# Patient Record
Sex: Female | Born: 1995 | Hispanic: No | Marital: Married | Smoking: Never smoker
Health system: Southern US, Community
[De-identification: ages and names within clinical notes are randomized; demographics above are authoritative.]

## PROBLEM LIST (undated history)

## (undated) ENCOUNTER — Inpatient Hospital Stay (HOSPITAL_COMMUNITY): Payer: Self-pay

## (undated) DIAGNOSIS — Z789 Other specified health status: Secondary | ICD-10-CM

## (undated) HISTORY — PX: NO PAST SURGERIES: SHX2092

---

## 2017-05-01 NOTE — L&D Delivery Note (Signed)
Delivery Note Mom pushed with excelled effort. Status int on the line during pushing and delivery. Terminal bradycardia noted and a t 2:19 PM a viable and healthy female was delivered via Vaginal, Spontaneous (Presentation: VTX;LOA  ).  APGAR: 6, 9; weight pending  .   Placenta status: schultz,  Complete/intact.  Cord: tight nuchal x1, unable to reduce, somersault through  with the following complications: .  Cord pH: NA  Anesthesia:  Epidural, local  Episiotomy: None Lacerations: 2nd degree Suture Repair: 3.0 monocryl  Est. Blood Loss (mL): 200  Mom to postpartum.  Baby to Couplet care / Skin to Skin.  Thressa ShellerHeather Kingston Guiles 08/17/2017, 2:46 PM

## 2017-07-22 ENCOUNTER — Encounter (HOSPITAL_COMMUNITY): Payer: Self-pay

## 2017-07-22 ENCOUNTER — Inpatient Hospital Stay (HOSPITAL_COMMUNITY): Payer: PPO

## 2017-07-22 ENCOUNTER — Inpatient Hospital Stay (HOSPITAL_COMMUNITY)
Admission: AD | Admit: 2017-07-22 | Discharge: 2017-07-22 | Disposition: A | Discharge status: Home / Self Care | Payer: PPO | Source: Ambulatory Visit | Attending: Obstetrics and Gynecology | Admitting: Obstetrics and Gynecology

## 2017-07-22 DIAGNOSIS — O0930 Supervision of pregnancy with insufficient antenatal care, unspecified trimester: Secondary | ICD-10-CM

## 2017-07-22 DIAGNOSIS — R12 Heartburn: Secondary | ICD-10-CM | POA: Insufficient documentation

## 2017-07-22 DIAGNOSIS — O0933 Supervision of pregnancy with insufficient antenatal care, third trimester: Secondary | ICD-10-CM

## 2017-07-22 DIAGNOSIS — O9989 Other specified diseases and conditions complicating pregnancy, childbirth and the puerperium: Secondary | ICD-10-CM

## 2017-07-22 DIAGNOSIS — Z3A35 35 weeks gestation of pregnancy: Secondary | ICD-10-CM

## 2017-07-22 DIAGNOSIS — B373 Candidiasis of vulva and vagina: Secondary | ICD-10-CM

## 2017-07-22 DIAGNOSIS — O26899 Other specified pregnancy related conditions, unspecified trimester: Secondary | ICD-10-CM | POA: Diagnosis not present

## 2017-07-22 DIAGNOSIS — Z3A00 Weeks of gestation of pregnancy not specified: Secondary | ICD-10-CM | POA: Diagnosis not present

## 2017-07-22 DIAGNOSIS — R109 Unspecified abdominal pain: Secondary | ICD-10-CM | POA: Diagnosis present

## 2017-07-22 DIAGNOSIS — O219 Vomiting of pregnancy, unspecified: Secondary | ICD-10-CM

## 2017-07-22 DIAGNOSIS — B3731 Acute candidiasis of vulva and vagina: Secondary | ICD-10-CM

## 2017-07-22 HISTORY — DX: Other specified health status: Z78.9

## 2017-07-22 LAB — WET PREP, GENITAL
Clue Cells Wet Prep HPF POC: NONE SEEN
Sperm: NONE SEEN
Trich, Wet Prep: NONE SEEN

## 2017-07-22 LAB — CBC
HCT: 29.8 % — ABNORMAL LOW (ref 36.0–46.0)
Hemoglobin: 9.6 g/dL — ABNORMAL LOW (ref 12.0–15.0)
MCH: 21.1 pg — ABNORMAL LOW (ref 26.0–34.0)
MCHC: 32.2 g/dL (ref 30.0–36.0)
MCV: 65.4 fL — ABNORMAL LOW (ref 78.0–100.0)
Platelets: 236 10*3/uL (ref 150–400)
RBC: 4.56 MIL/uL (ref 3.87–5.11)
RDW: 18.3 % — ABNORMAL HIGH (ref 11.5–15.5)
WBC: 7.7 10*3/uL (ref 4.0–10.5)

## 2017-07-22 LAB — URINALYSIS, ROUTINE W REFLEX MICROSCOPIC
Bilirubin Urine: NEGATIVE
GLUCOSE, UA: NEGATIVE mg/dL
HGB URINE DIPSTICK: NEGATIVE
Ketones, ur: NEGATIVE mg/dL
NITRITE: NEGATIVE
PH: 6 (ref 5.0–8.0)
Protein, ur: NEGATIVE mg/dL
Specific Gravity, Urine: 1.011 (ref 1.005–1.030)

## 2017-07-22 LAB — OB RESULTS CONSOLE GBS: GBS: POSITIVE

## 2017-07-22 MED ORDER — TERCONAZOLE 0.8 % VA CREA: 1.0000 | TOPICAL_CREAM | Freq: Every day | VAGINAL | 0 refills | Status: AC

## 2017-07-22 MED ORDER — ONDANSETRON 8 MG PO TBDP
8.0000 mg | ORAL_TABLET | Freq: Once | ORAL | Status: AC
  Administered 2017-07-22: 8 mg via ORAL
  Filled 2017-07-22: qty 1

## 2017-07-22 MED ORDER — PRENATAL VITAMINS 0.8 MG PO TABS: 1.0000 | ORAL_TABLET | Freq: Every day | ORAL | 1 refills | Status: DC

## 2017-07-22 MED ORDER — RANITIDINE HCL 300 MG PO TABS: 300.0000 mg | ORAL_TABLET | Freq: Every day | ORAL | 1 refills | Status: AC

## 2017-07-22 MED ORDER — ACETAMINOPHEN 500 MG PO TABS
1000.0000 mg | ORAL_TABLET | Freq: Once | ORAL | Status: AC
  Administered 2017-07-22: 1000 mg via ORAL
  Filled 2017-07-22: qty 2

## 2017-07-22 NOTE — MAU Provider Note (Addendum)
History     CSN: [id number] Arrival date and time: 07/22/17 1850    Chief Complaint  Patient presents with  . Abdominal Pain   Patient with PNC in [place], moved earlier this month with no contributory medical history presents for 2 week h/o nausea, heartburn, and occasional vomiting. Denies sick contacts, diarrhea. Does not know due date, dating today based on LMP. Also c/o few weeks of increased yellow vaginal discharge, received vaginal Miconazole from provider in [place] with not much relief. 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  OB History    Gravida  4   Para      Term      Preterm      AB  3   Living        SAB  3   TAB      Ectopic      Multiple      Live Births              Past Medical History:  Diagnosis Date  . Medical history non-contributory     Past Surgical History:  Procedure Laterality Date  . NO PAST SURGERIES     Family History  Problem Relation Age of Onset  . Alcohol abuse Neg Hx   . Arthritis Neg Hx   . Asthma Neg Hx   . Cancer Neg Hx   . Birth defects Neg Hx   . COPD Neg Hx   . Depression Neg Hx   . Drug abuse Neg Hx   . Diabetes Neg Hx   . Early death Neg Hx   . Hearing loss Neg Hx   . Heart disease Neg Hx   . Hyperlipidemia Neg Hx   . Kidney disease Neg Hx   . Hypertension Neg Hx   . Mental illness Neg Hx   . Learning disabilities Neg Hx   . Mental retardation Neg Hx   . Miscarriages / Stillbirths Neg Hx   . Stroke Neg Hx   . Vision loss Neg Hx   . Varicose Veins Neg Hx     Social History   Tobacco Use  . Smoking status: Never Smoker  . Smokeless tobacco: Never Used  Substance Use Topics  . Alcohol use: Never    Frequency: Never  . Drug use: Never    Allergies: No Known Allergies  No medications prior to admission.    Review of Systems  Constitutional: Negative for fever.  Respiratory: Negative for  shortness of breath.   Gastrointestinal: Positive for nausea and vomiting. Negative for diarrhea.       No hematemesis  Genitourinary: Positive for dyspareunia, vaginal discharge and vaginal pain. Negative for vaginal bleeding.       Vaginal itching   Physical Exam   Blood pressure 123/79, pulse (!) 110, temperature 98.3 F (36.8 C), temperature source Oral, resp. rate 16, height 5\' 6"  (1.676 m), weight 116 lb (52.6 kg), SpO2 100 %.  Physical Exam  Constitutional: She is oriented to person, place, and time. She appears well-developed and well-nourished. No distress.  Cardiovascular: Normal rate.  Respiratory: Effort normal. No respiratory distress.  GI:  Gravid abdomen  Neurological: She is alert and oriented to person, place, and time.  Skin: Skin is warm and dry. She is not diaphoretic.  Psychiatric: She has a normal mood and affect. Her behavior is normal.   FHT: baseline rate 135, moderate variability, +acel,  no decel  MAU Course  Procedures  MDM Patient with symptoms consistent with heartburn. Zofran given for nausea with some improvement in symptoms. Initial prenatal labs drawn today. Wet prep c/w yeast infection. U/A with moderate leuks and rare bacteria, no nitrities, urine culture sent.  Limited US performed with no abnormalities noted.  Reactive NST  Assessment and Plan   Heartburn - Zantac 300mg  daily Yeast infection - vaginal fluconazole Limited PNC  - Will make patient an appointment to follow up for prenatal appointment with complete US. Initial prenatal labs drawn today. Given prescription for prenatal vitamins. Discharge to home.  Ellwood Denselison Rumball 07/22/2017, 9:38 PM   OB FELLOW MAU DISCHARGE ATTESTATION I have seen and examined this patient; I agree with above documentation in the resident's note.   S: 22 y.o. G4P0030 with IUP at 1052w4d by LMP p/w nausea, vomiting about twice daily and GER symptoms, as well as sx of yeast vaginitis. [place] in [place], move here earlier this month, but no established care. Reports good fetal movement. Denies ctx, vaginal bleeding, LOF.   O: VS wnl as above. Normal PE  Wet prep c/w yeast vaginitis Urine sent for culture Prenatal labs drawn and GBS culture collected Nausea improved with Zofran. Reactive NST as above  A/P: - Yeast vaginitis: terconazole - GER/nausea: start daily H2B - Limited PNC: complete U/S ordered to be outpatient, and message sent to clinic to schedule PNV ASAP.   Frederik PearJulie P Degele, MD OB Fellow 12:02 AM

## 2017-07-22 NOTE — MAU Note (Signed)
Pt reports vomiting and lower abdominal pain that started 5 days ago-states she has not taken any medication for this. Pt states she has vomited only once. Pt denies bleeding or LOF. Pt reports good fetal movement. States she is [redacted] weeks pregnant. States she just moved here and has not seen a doctor here-[facility].

## 2017-07-22 NOTE — Discharge Instructions (Signed)

## 2017-07-23 LAB — HEPATITIS B SURFACE ANTIGEN: Hepatitis B Surface Ag: NEGATIVE

## 2017-07-23 LAB — RPR: RPR Ser Ql: NONREACTIVE

## 2017-07-23 LAB — GC/CHLAMYDIA PROBE AMP (~~LOC~~) NOT AT ARMC
Chlamydia: NEGATIVE
Neisseria Gonorrhea: NEGATIVE

## 2017-07-23 LAB — ABO/RH: ABO/RH(D): B POS

## 2017-07-23 LAB — HIV ANTIBODY (ROUTINE TESTING W REFLEX): HIV Screen 4th Generation wRfx: NONREACTIVE

## 2017-07-23 NOTE — Addendum Note (Signed)
Encounter addended by: Allegra LaiNewman, Mark Glidwell, MD on: 07/23/2017 8:41 AM  Actions taken: Charge Capture section accepted

## 2017-07-24 ENCOUNTER — Encounter: Payer: Self-pay | Admitting: Obstetrics and Gynecology

## 2017-07-24 DIAGNOSIS — R8271 Bacteriuria: Secondary | ICD-10-CM | POA: Insufficient documentation

## 2017-07-24 LAB — CULTURE, BETA STREP (GROUP B ONLY)

## 2017-07-24 LAB — RUBELLA SCREEN: Rubella: 3.39 index (ref >0.99)

## 2017-07-24 LAB — CULTURE, OB URINE

## 2017-07-25 ENCOUNTER — Telehealth: Payer: Self-pay | Admitting: Advanced Practice Midwife

## 2017-07-25 NOTE — Telephone Encounter (Signed)
Pt positive for GBS UTI, with 10,000 colonies only.  Attempt to call pt but voicemail not set up.  Attempt to send Rx for Keflex to pharmacy but no pharmacy in chart.  Will add to sticky note, [facility]

## 2017-07-27 ENCOUNTER — Other Ambulatory Visit (HOSPITAL_COMMUNITY): Payer: Self-pay | Admitting: Certified Nurse Midwife

## 2017-07-27 ENCOUNTER — Ambulatory Visit (HOSPITAL_COMMUNITY)
Admission: RE | Admit: 2017-07-27 | Discharge: 2017-07-27 | Disposition: A | Discharge status: Home / Self Care | Payer: PPO | Source: Ambulatory Visit | Attending: Certified Nurse Midwife | Admitting: Certified Nurse Midwife

## 2017-07-27 DIAGNOSIS — Z3689 Encounter for other specified antenatal screening: Secondary | ICD-10-CM

## 2017-07-27 DIAGNOSIS — O0930 Supervision of pregnancy with insufficient antenatal care, unspecified trimester: Secondary | ICD-10-CM

## 2017-07-27 DIAGNOSIS — Z3A36 36 weeks gestation of pregnancy: Secondary | ICD-10-CM | POA: Diagnosis not present

## 2017-07-27 DIAGNOSIS — O0933 Supervision of pregnancy with insufficient antenatal care, third trimester: Secondary | ICD-10-CM | POA: Insufficient documentation

## 2017-07-27 NOTE — Addendum Note (Signed)
Encounter addended by: Marcellina MillinMoser, Tykeem Lanzer L, RTR on: 07/27/2017 4:29 PM  Actions taken: Imaging Exam ended

## 2017-07-31 ENCOUNTER — Telehealth: Payer: Self-pay | Admitting: General Practice

## 2017-07-31 ENCOUNTER — Encounter: Payer: Self-pay | Admitting: General Practice

## 2017-07-31 NOTE — Telephone Encounter (Signed)
Called patient to schedule New OB appointment (with interpreter), unable to reach patient via phone.  Letter has been mailed to patient.

## 2017-08-08 ENCOUNTER — Ambulatory Visit (INDEPENDENT_AMBULATORY_CARE_PROVIDER_SITE_OTHER): Payer: Self-pay

## 2017-08-08 VITALS — BP 116/72 | HR 87 | Wt 117.1 lb

## 2017-08-08 DIAGNOSIS — Z3400 Encounter for supervision of normal first pregnancy, unspecified trimester: Secondary | ICD-10-CM

## 2017-08-08 DIAGNOSIS — Z3403 Encounter for supervision of normal first pregnancy, third trimester: Secondary | ICD-10-CM

## 2017-08-08 DIAGNOSIS — R8271 Bacteriuria: Secondary | ICD-10-CM

## 2017-08-08 LAB — POCT URINALYSIS DIP (DEVICE)
Bilirubin Urine: NEGATIVE
Glucose, UA: NEGATIVE mg/dL
Ketones, ur: NEGATIVE mg/dL
Nitrite: NEGATIVE
Protein, ur: NEGATIVE mg/dL
Specific Gravity, Urine: 1.015 (ref 1.005–1.030)
Urobilinogen, UA: 1 mg/dL (ref 0.0–1.0)
pH: 6 (ref 5.0–8.0)

## 2017-08-08 MED ORDER — FERROUS SULFATE 325 (65 FE) MG PO TABS: 325.0000 mg | ORAL_TABLET | Freq: Every day | ORAL | 3 refills | Status: AC

## 2017-08-08 MED ORDER — PRENATAL VITAMINS 0.8 MG PO TABS: 1.0000 | ORAL_TABLET | Freq: Every day | ORAL | 6 refills | Status: DC

## 2017-08-08 MED ORDER — CEPHALEXIN 500 MG PO CAPS: 500.0000 mg | ORAL_CAPSULE | Freq: Three times a day (TID) | ORAL | 0 refills | Status: AC

## 2017-08-08 MED ORDER — PRENATAL 27-0.8 MG PO TABS: 1.0000 | ORAL_TABLET | Freq: Every day | ORAL | 11 refills | Status: AC

## 2017-08-08 NOTE — Progress Notes (Signed)
Decline flu and Tdap and Pap smear.

## 2017-08-08 NOTE — Progress Notes (Signed)
Subjective:   Peggy Kelley is a 22 y.o. G4P0030 at [redacted]w[redacted]d by LMP being seen today for her first obstetrical visit.  Her obstetrical history is significant for 3 previous miscarriages and has GBS bacteriuria and Supervision of normal first pregnancy, antepartum on their problem list.. Patient does intend to breast feed. Pregnancy history fully reviewed.  Patient reports no complaints. She recently moved from [place] and reports no problems during prenatal care.  HISTORY: OB History  Gravida Para Term Preterm AB Living  4 0 0 0 3 0  SAB TAB Ectopic Multiple Live Births  3 0 0 0 0    # Outcome Date GA Lbr Len/2nd Weight Sex Delivery Anes PTL Lv  4 Current           3 SAB           2 SAB           1 SAB            Past Medical History:  Diagnosis Date  . Medical history non-contributory    Past Surgical History:  Procedure Laterality Date  . NO PAST SURGERIES     Family History  Problem Relation Age of Onset  . Alcohol abuse Neg Hx   . Arthritis Neg Hx   . Asthma Neg Hx   . Cancer Neg Hx   . Birth defects Neg Hx   . COPD Neg Hx   . Depression Neg Hx   . Drug abuse Neg Hx   . Diabetes Neg Hx   . Early death Neg Hx   . Hearing loss Neg Hx   . Heart disease Neg Hx   . Hyperlipidemia Neg Hx   . Kidney disease Neg Hx   . Hypertension Neg Hx   . Mental illness Neg Hx   . Learning disabilities Neg Hx   . Mental retardation Neg Hx   . Miscarriages / Stillbirths Neg Hx   . Stroke Neg Hx   . Vision loss Neg Hx   . Varicose Veins Neg Hx    Social History   Tobacco Use  . Smoking status: Never Smoker  . Smokeless tobacco: Never Used  Substance Use Topics  . Alcohol use: Never    Frequency: Never  . Drug use: Never   No Known Allergies Current Outpatient Medications on File Prior to Visit  Medication Sig Dispense Refill  . ranitidine (ZANTAC) 300 MG tablet Take 1 tablet (300 mg total) by mouth at bedtime. 30 tablet 1  . terconazole (TERAZOL 3) 0.8 % vaginal  cream Place 1 applicator vaginally at bedtime. (Patient not taking: Reported on 08/08/2017) 20 g 0   No current facility-administered medications on file prior to visit.      Exam   Vitals:   08/08/17 1354  BP: 116/72  Pulse: 87  Weight: 117 lb 1.6 oz (53.1 kg)   Fetal Heart Rate (bpm): 153  Uterus:  Fundal Height: 38 cm  Pelvic Exam: Perineum: no hemorrhoids, normal perineum   Vulva: normal external genitalia, no lesions   Vagina:  normal mucosa, normal discharge   Cervix: no lesions and normal, pap smear done.    Adnexa: normal adnexa and no mass, fullness, tenderness   Bony Pelvis: average  System: General: well-developed, well-nourished female in no acute distress   Breast:  normal appearance, no masses or tenderness   Skin: normal coloration and turgor, no rashes   Neurologic: oriented, normal, negative, normal mood  Extremities: normal strength, tone, and muscle mass, ROM of all joints is normal   HEENT PERRLA, extraocular movement intact and sclera clear, anicteric   Mouth/Teeth mucous membranes moist, pharynx normal without lesions and dental hygiene good   Neck supple and no masses   Cardiovascular: regular rate and rhythm   Respiratory:  no respiratory distress, normal breath sounds   Abdomen: soft, non-tender; bowel sounds normal; no masses,  no organomegaly     Assessment:   Pregnancy: G4P0030 Patient Active Problem List   Diagnosis Date Noted  . Supervision of normal first pregnancy, antepartum 08/08/2017  . GBS bacteriuria 07/24/2017     Plan:  1. GBS bacteriuria - RX sent to patient's pharmacy - Will need prophylaxis in labor  2. Supervision of normal first pregnancy, antepartum - No complaints. Routine care - Hgb 9.6 with prenatal labs, supplement sent to patient's pharmacy  Continue prenatal vitamins. Genetic Screening discussed, First trimester screen, Quad screen and NIPS: too late. Ultrasound discussed; fetal anatomic survey: results  reviewed. Problem list reviewed and updated. The nature of [facility] with multiple MDs and other Advanced Practice Providers was explained to patient; also emphasized that residents, students are part of our team. Routine obstetric precautions reviewed. Return in about 1 week (around 08/15/2017) for Return OB visit.  Rolm BookbinderCaroline M Neill, CNM 08/08/17 2:59 PM

## 2017-08-08 NOTE — Addendum Note (Signed)
Addended by: Rolm BookbinderNEILL, Westyn Keatley M on: 08/08/2017 04:28 PM   Modules accepted: Orders

## 2017-08-08 NOTE — Patient Instructions (Signed)

## 2017-08-17 ENCOUNTER — Inpatient Hospital Stay (HOSPITAL_COMMUNITY): Payer: PPO | Admitting: Anesthesiology

## 2017-08-17 ENCOUNTER — Inpatient Hospital Stay (HOSPITAL_COMMUNITY)
Admission: AD | Admit: 2017-08-17 | Discharge: 2017-08-19 | DRG: 807 | Disposition: A | Discharge status: Home / Self Care | Payer: PPO | Source: Ambulatory Visit | Attending: Family Medicine | Admitting: Family Medicine

## 2017-08-17 ENCOUNTER — Encounter (HOSPITAL_COMMUNITY): Payer: Self-pay | Admitting: *Deleted

## 2017-08-17 DIAGNOSIS — O99824 Streptococcus B carrier state complicating childbirth: Secondary | ICD-10-CM | POA: Diagnosis present

## 2017-08-17 DIAGNOSIS — O4292 Full-term premature rupture of membranes, unspecified as to length of time between rupture and onset of labor: Principal | ICD-10-CM | POA: Diagnosis present

## 2017-08-17 DIAGNOSIS — Z3A39 39 weeks gestation of pregnancy: Secondary | ICD-10-CM

## 2017-08-17 DIAGNOSIS — R8271 Bacteriuria: Secondary | ICD-10-CM

## 2017-08-17 LAB — CBC
HCT: 28.5 % — ABNORMAL LOW (ref 36.0–46.0)
Hemoglobin: 9.2 g/dL — ABNORMAL LOW (ref 12.0–15.0)
MCH: 20.1 pg — AB (ref 26.0–34.0)
MCHC: 32.3 g/dL (ref 30.0–36.0)
MCV: 62.4 fL — AB (ref 78.0–100.0)
Platelets: 213 10*3/uL (ref 150–400)
RBC: 4.57 MIL/uL (ref 3.87–5.11)
RDW: 19.2 % — AB (ref 11.5–15.5)
WBC: 8.2 10*3/uL (ref 4.0–10.5)

## 2017-08-17 LAB — TYPE AND SCREEN
ABO/RH(D): B POS
Antibody Screen: NEGATIVE

## 2017-08-17 MED ORDER — DIPHENHYDRAMINE HCL 25 MG PO CAPS: 25.0000 mg | ORAL_CAPSULE | Freq: Four times a day (QID) | ORAL | Status: DC | PRN

## 2017-08-17 MED ORDER — PENICILLIN G POT IN DEXTROSE 60000 UNIT/ML IV SOLN
3.0000 10*6.[IU] | INTRAVENOUS | Status: DC
  Administered 2017-08-17: 3 10*6.[IU] via INTRAVENOUS
  Filled 2017-08-17 (×4): qty 50

## 2017-08-17 MED ORDER — SIMETHICONE 80 MG PO CHEW: 80.0000 mg | CHEWABLE_TABLET | ORAL | Status: DC | PRN

## 2017-08-17 MED ORDER — DIBUCAINE 1 % RE OINT: 1.0000 "application " | TOPICAL_OINTMENT | RECTAL | Status: DC | PRN

## 2017-08-17 MED ORDER — SODIUM CHLORIDE 0.9 % IV SOLN
5.0000 10*6.[IU] | Freq: Once | INTRAVENOUS | Status: AC
  Administered 2017-08-17: 5 10*6.[IU] via INTRAVENOUS
  Filled 2017-08-17: qty 5

## 2017-08-17 MED ORDER — OXYCODONE-ACETAMINOPHEN 5-325 MG PO TABS: 1.0000 | ORAL_TABLET | ORAL | Status: DC | PRN

## 2017-08-17 MED ORDER — ONDANSETRON HCL 4 MG/2ML IJ SOLN: 4.0000 mg | Freq: Four times a day (QID) | INTRAMUSCULAR | Status: DC | PRN

## 2017-08-17 MED ORDER — FENTANYL CITRATE (PF) 100 MCG/2ML IJ SOLN
100.0000 ug | INTRAMUSCULAR | Status: DC | PRN
  Filled 2017-08-17: qty 2

## 2017-08-17 MED ORDER — TETANUS-DIPHTH-ACELL PERTUSSIS 5-2.5-18.5 LF-MCG/0.5 IM SUSP: 0.5000 mL | Freq: Once | INTRAMUSCULAR | Status: DC

## 2017-08-17 MED ORDER — ACETAMINOPHEN 325 MG PO TABS: 650.0000 mg | ORAL_TABLET | ORAL | Status: DC | PRN

## 2017-08-17 MED ORDER — OXYTOCIN BOLUS FROM INFUSION
500.0000 mL | Freq: Once | INTRAVENOUS | Status: AC
  Administered 2017-08-17: 500 mL via INTRAVENOUS

## 2017-08-17 MED ORDER — SOD CITRATE-CITRIC ACID 500-334 MG/5ML PO SOLN: 30.0000 mL | ORAL | Status: DC | PRN

## 2017-08-17 MED ORDER — ACETAMINOPHEN 325 MG PO TABS
650.0000 mg | ORAL_TABLET | ORAL | Status: DC | PRN
  Administered 2017-08-19: 650 mg via ORAL
  Filled 2017-08-17: qty 2

## 2017-08-17 MED ORDER — PHENYLEPHRINE 40 MCG/ML (10ML) SYRINGE FOR IV PUSH (FOR BLOOD PRESSURE SUPPORT)
80.0000 ug | PREFILLED_SYRINGE | INTRAVENOUS | Status: DC | PRN
  Filled 2017-08-17: qty 5
  Filled 2017-08-17: qty 10

## 2017-08-17 MED ORDER — BENZOCAINE-MENTHOL 20-0.5 % EX AERO
1.0000 "application " | INHALATION_SPRAY | CUTANEOUS | Status: DC | PRN
  Administered 2017-08-17: 1 via TOPICAL
  Filled 2017-08-17: qty 56

## 2017-08-17 MED ORDER — LIDOCAINE HCL (PF) 1 % IJ SOLN
30.0000 mL | INTRAMUSCULAR | Status: AC | PRN
  Administered 2017-08-17: 30 mL via SUBCUTANEOUS
  Filled 2017-08-17: qty 30

## 2017-08-17 MED ORDER — DIPHENHYDRAMINE HCL 50 MG/ML IJ SOLN: 12.5000 mg | INTRAMUSCULAR | Status: DC | PRN

## 2017-08-17 MED ORDER — PRENATAL MULTIVITAMIN CH
1.0000 | ORAL_TABLET | Freq: Every day | ORAL | Status: DC
  Administered 2017-08-18 – 2017-08-19 (×2): 1 via ORAL
  Filled 2017-08-17 (×2): qty 1

## 2017-08-17 MED ORDER — ZOLPIDEM TARTRATE 5 MG PO TABS: 5.0000 mg | ORAL_TABLET | Freq: Every evening | ORAL | Status: DC | PRN

## 2017-08-17 MED ORDER — COCONUT OIL OIL: 1.0000 "application " | TOPICAL_OIL | Status: DC | PRN

## 2017-08-17 MED ORDER — LACTATED RINGERS IV SOLN
INTRAVENOUS | Status: DC
  Administered 2017-08-17 (×2): via INTRAVENOUS

## 2017-08-17 MED ORDER — EPHEDRINE 5 MG/ML INJ
10.0000 mg | INTRAVENOUS | Status: DC | PRN
  Filled 2017-08-17: qty 2

## 2017-08-17 MED ORDER — WITCH HAZEL-GLYCERIN EX PADS: 1.0000 "application " | MEDICATED_PAD | CUTANEOUS | Status: DC | PRN

## 2017-08-17 MED ORDER — LACTATED RINGERS IV SOLN
500.0000 mL | Freq: Once | INTRAVENOUS | Status: AC
  Administered 2017-08-17: 500 mL via INTRAVENOUS

## 2017-08-17 MED ORDER — LACTATED RINGERS IV SOLN: 500.0000 mL | INTRAVENOUS | Status: DC | PRN

## 2017-08-17 MED ORDER — OXYCODONE-ACETAMINOPHEN 5-325 MG PO TABS: 2.0000 | ORAL_TABLET | ORAL | Status: DC | PRN

## 2017-08-17 MED ORDER — OXYTOCIN 40 UNITS IN LACTATED RINGERS INFUSION - SIMPLE MED
2.5000 [IU]/h | INTRAVENOUS | Status: DC
  Filled 2017-08-17: qty 1000

## 2017-08-17 MED ORDER — ONDANSETRON HCL 4 MG PO TABS: 4.0000 mg | ORAL_TABLET | ORAL | Status: DC | PRN

## 2017-08-17 MED ORDER — LIDOCAINE HCL (PF) 1 % IJ SOLN
INTRAMUSCULAR | Status: DC | PRN
  Administered 2017-08-17: 8 mL via EPIDURAL

## 2017-08-17 MED ORDER — SENNOSIDES-DOCUSATE SODIUM 8.6-50 MG PO TABS
2.0000 | ORAL_TABLET | ORAL | Status: DC
  Administered 2017-08-17 – 2017-08-18 (×2): 2 via ORAL
  Filled 2017-08-17 (×2): qty 2

## 2017-08-17 MED ORDER — FENTANYL 2.5 MCG/ML BUPIVACAINE 1/10 % EPIDURAL INFUSION (WH - ANES)
14.0000 mL/h | INTRAMUSCULAR | Status: DC | PRN
  Administered 2017-08-17: 14 mL/h via EPIDURAL
  Filled 2017-08-17: qty 100

## 2017-08-17 MED ORDER — PHENYLEPHRINE 40 MCG/ML (10ML) SYRINGE FOR IV PUSH (FOR BLOOD PRESSURE SUPPORT)
80.0000 ug | PREFILLED_SYRINGE | INTRAVENOUS | Status: DC | PRN
  Filled 2017-08-17: qty 10
  Filled 2017-08-17: qty 5

## 2017-08-17 MED ORDER — IBUPROFEN 600 MG PO TABS
600.0000 mg | ORAL_TABLET | Freq: Four times a day (QID) | ORAL | Status: DC
  Administered 2017-08-17 – 2017-08-19 (×7): 600 mg via ORAL
  Filled 2017-08-17 (×8): qty 1

## 2017-08-17 MED ORDER — ONDANSETRON HCL 4 MG/2ML IJ SOLN: 4.0000 mg | INTRAMUSCULAR | Status: DC | PRN

## 2017-08-17 MED ORDER — OXYTOCIN 40 UNITS IN LACTATED RINGERS INFUSION - SIMPLE MED
1.0000 m[IU]/min | INTRAVENOUS | Status: DC
  Administered 2017-08-17: 2 m[IU]/min via INTRAVENOUS

## 2017-08-17 NOTE — Anesthesia Pain Management Evaluation Note (Signed)
  CRNA Pain Management Visit Note  Patient: Wilmon ArmsSana Honea, 22 y.o., female  "Hello [facility] am [facility]. We have an anesthesia team available at all times to provide care throughout the hospital, including epidural management and anesthesia for C-section. I don't know your plan for the delivery whether it a natural birth, water birth, IV sedation, nitrous supplementation, doula or epidural, but we want to meet your pain goals."   1.Was your pain managed to your expectations on prior hospitalizations?   No prior hospitalizations  2.What is your expectation for pain management during this hospitalization?     IV pain meds  3.How can we help you reach that goal? unsure  Record the patient's initial score and the patient's pain goal.   Pain: 7  Pain Goal: 7 [facility] wants you to be able to say your pain was always managed very well.  Cephus ShellingBURGER,Raun Routh 08/17/2017

## 2017-08-17 NOTE — Anesthesia Postprocedure Evaluation (Signed)
Anesthesia Post Note  Patient: Peggy Kelley  Procedure(s) Performed: AN AD HOC LABOR EPIDURAL     Patient location during evaluation: Mother Baby Anesthesia Type: Epidural Level of consciousness: awake and alert Pain management: pain level controlled Vital Signs Assessment: post-procedure vital signs reviewed and stable Respiratory status: spontaneous breathing, nonlabored ventilation and respiratory function stable Cardiovascular status: stable Postop Assessment: no headache, no backache and epidural receding Anesthetic complications: no    Last Vitals:  Vitals:   08/17/17 1600 08/17/17 1625  BP: 110/69 107/78  Pulse: 81 (!) 102  Resp: 18 18  Temp:  36.8 C  SpO2:  99%    Last Pain:  Vitals:   08/17/17 1625  TempSrc: Oral  PainSc: 1    Pain Goal:                 [name]

## 2017-08-17 NOTE — MAU Note (Signed)
PT SAYS   WITH HUSBAND    PNC   WITH  CLINIC .  THINKS  SROM  AT 0200    USING  INTERPRETER  TAREK-  [id number]

## 2017-08-17 NOTE — H&P (Signed)
Peggy Kelley is a 22 y.o. female 234P0030 with IUP at 4995w1d presenting for ROM at 0200. Pt states she has been having irregular, every 2-5 minutes contractions, associated with none vaginal bleeding for several hours..  Membranes are ruptured, clear fluid, with active fetal movement.   [facility]  [place], [facility] based on L[facility].    Past Medical History: Past Medical History:  Diagnosis Date  . Medical history non-contributory     Past Surgical History: Past Surgical History:  Procedure Laterality Date  . NO PAST SURGERIES      Obstetrical History: OB History    Gravida  4   Para      Term      Preterm      AB  3   Living  0     SAB  3   TAB      Ectopic      Multiple      Live Births               Social History: Social History   Socioeconomic History  . Marital status: Married    Spouse name: Not on file  . Number of children: Not on file  . Years of education: Not on file  . Highest education level: Not on file  Occupational History  . Not on file  Social Needs  . Financial resource strain: Not on file  . Food insecurity:    Worry: Not on file    Inability: Not on file  . Transportation needs:    Medical: Not on file    Non-medical: Not on file  Tobacco Use  . Smoking status: Never Smoker  . Smokeless tobacco: Never Used  Substance and Sexual Activity  . Alcohol use: Never    Frequency: Never  . Drug use: Never  . Sexual activity: Yes    Birth control/protection: None  Lifestyle  . Physical activity:    Days per week: Not on file    Minutes per session: Not on file  . Stress: Not on file  Relationships  . Social connections:    Talks on phone: Not on file    Gets together: Not on file    Attends religious service: Not on file    Active member of club or organization: Not on file    Attends meetings of clubs or organizations: Not on file    Relationship  status: Not on file  Other Topics Concern  . Not on file  Social History Narrative  . Not on file    Family History: Family History  Problem Relation Age of Onset  . Alcohol abuse Neg Hx   . Arthritis Neg Hx   . Asthma Neg Hx   . Cancer Neg Hx   . Birth defects Neg Hx   . COPD Neg Hx   . Depression Neg Hx   . Drug abuse Neg Hx   . Diabetes Neg Hx   . Early death Neg Hx   . Hearing loss Neg Hx   . Heart disease Neg Hx   . Hyperlipidemia Neg Hx   . Kidney disease Neg Hx   . Hypertension Neg Hx   . Mental illness Neg Hx   . Learning disabilities Neg Hx   . Mental retardation Neg Hx   . Miscarriages / Stillbirths Neg Hx   . Stroke Neg Hx   .  Vision loss Neg Hx   . Varicose Veins Neg Hx     Allergies: No Known Allergies  Medications Prior to Admission  Medication Sig Dispense Refill Last Dose  . cephALEXin (KEFLEX) 500 MG capsule Take 1 capsule (500 mg total) by mouth 3 (three) times daily. 21 capsule 0 Past Week at Unknown time  . ferrous sulfate 325 (65 FE) MG tablet Take 1 tablet (325 mg total) by mouth daily with breakfast. 30 tablet 3 Past Week at Unknown time  . Prenatal Multivit-Min-Fe-FA (PRENATAL VITAMINS) 0.8 MG tablet Take 1 tablet by mouth daily. 90 tablet 6 Past Week at Unknown time  . terconazole (TERAZOL 3) 0.8 % vaginal cream Place 1 applicator vaginally at bedtime. 20 g 0 Past Week at Unknown time  . Prenatal Vit-Fe Fumarate-FA (MULTIVITAMIN-PRENATAL) 27-0.8 MG TABS tablet Take 1 tablet by mouth daily at 12 noon. 30 each 11   . ranitidine (ZANTAC) 300 MG tablet Take 1 tablet (300 mg total) by mouth at bedtime. 30 tablet 1 NOT TAKING        Review of Systems   Constitutional: Negative for fever and chills Eyes: Negative for visual disturbances Respiratory: Negative for shortness of breath, dyspnea Cardiovascular: Negative for chest pain or palpitations  Gastrointestinal: Negative for vomiting, diarrhea and constipation.  POSITIVE for abdominal pain  (contractions) Genitourinary: Negative for dysuria and urgency Musculoskeletal: Negative for back pain, joint pain, myalgias  Neurological: Negative for dizziness and headaches      Blood pressure 124/69, pulse (!) 105, temperature 97.8 F (36.6 C), temperature source Oral, resp. rate 20, height 5' 5.5" (1.664 m), weight 53.9 kg (118 lb 12 oz). General appearance: alert, cooperative and mild distress Lungs: clear to auscultation bilaterally Heart: regular rate and rhythm Abdomen: soft, non-tender; bowel sounds normal Extremities: Homans sign is negative, no sign of DVT DTR's 2+ Presentation: cephalic Fetal monitoring  Baseline: 140 bpm, Variability: Good {> 6 bpm), Accelerations: Reactive and Decelerations: Absent Uterine activity  2-4     Prenatal labs: ABO, Rh: --/--/B POS Performed at [facility], [address] [place], [place] [address]  [phone]/24 2225) Antibody:   Rubella: 3.39 (03/24 2225) RPR: Non Reactive (03/24 2225)  HBsAg: Negative (03/24 2225)  HIV: Non Reactive (03/24 2225)  GBS:   +   Prenatal Transfer Tool  Maternal Diabetes: unknown Genetic Screening: Declined Maternal Ultrasounds/Referrals: Normal Fetal Ultrasounds or other Referrals:  None Maternal Substance Abuse:  No Significant Maternal Medications:  None Significant Maternal Lab Results: Lab values include: Group B Strep positive     No results found for this or any previous visit (from the past 24 hour(s)).  Assessment: Peggy Kelley is a 22 y.o. G4P0030 with an IUP at [redacted]w[redacted]d presenting for SROM, probably early labor. PT does not allow vaginal exams (legs clinch together).  Presentation verified via [place]  Plan: #Labor: expectant management #Pain:  Per request #FWB Cat 1 #ID: GBS: PCN    Jacklyn Shell 08/17/2017, 7:13 AM

## 2017-08-17 NOTE — Anesthesia Procedure Notes (Signed)
Epidural Patient location during procedure: OB Start time: 08/17/2017 8:56 AM End time: 08/17/2017 9:04 AM  Staffing Anesthesiologist: Bethena Midgetddono, Dodger Sinning, MD  Preanesthetic Checklist Completed: patient identified, site marked, surgical consent, pre-op evaluation, timeout performed, IV checked, risks and benefits discussed and monitors and equipment checked  Epidural Patient position: sitting Prep: site prepped and draped and DuraPrep Patient monitoring: continuous pulse ox and blood pressure Approach: midline Injection technique: LOR air  Needle:  Needle type: Tuohy  Needle gauge: 17 G Needle length: 9 cm and 9 Needle insertion depth: 4 cm Catheter type: closed end flexible Catheter size: 19 Gauge Catheter at skin depth: 9 cm Test dose: negative  Assessment Events: blood not aspirated, injection not painful, no injection resistance, negative IV test and no paresthesia

## 2017-08-17 NOTE — Anesthesia Preprocedure Evaluation (Signed)

## 2017-08-17 NOTE — Progress Notes (Signed)
Pt requests rest, no nurses in room please

## 2017-08-18 ENCOUNTER — Other Ambulatory Visit: Payer: Self-pay

## 2017-08-18 LAB — RPR: RPR: NONREACTIVE

## 2017-08-18 NOTE — Progress Notes (Signed)
Post Partum Day 1 Subjective: Eating, drinking, voiding, ambulating well.  +flatus.  Lochia and pain wnl.  Denies dizziness, lightheadedness, or sob. No complaints.   Objective: Blood pressure 107/64, pulse 72, temperature 98.6 F (37 C), temperature source Oral, resp. rate 16, height 5' 5.5" (1.664 m), weight 53.9 kg (118 lb 12 oz), SpO2 100 %, unknown if currently breastfeeding.  Physical Exam:  General: alert, cooperative and no distress Lochia: appropriate Uterine Fundus: firm Incision: n/a DVT Evaluation: No evidence of DVT seen on physical exam. Negative Homan's sign. No cords or calf tenderness. No significant calf/ankle edema.  Recent Labs    08/17/17 0645  HGB 9.2*  HCT 28.5*    Assessment/Plan: Plan for discharge tomorrow, Breastfeeding and Contraception condoms   LOS: 1 day   Cheral MarkerKimberly R Booker 08/18/2017, 11:01 AM

## 2017-08-18 NOTE — Lactation Note (Addendum)
This note was copied from a baby's chart. Lactation Consultation Note  Patient Name: Peggy Kelley Reason for consult: Initial assessment;Primapara;1st time breastfeeding;Term  Used [employer] interpreter services for Arabic, Abdulhaleem # [phone]  4826 hours old female who is still being exclusively BF by her mother, she's a P1. Mom is also planning to supplement with formula at some point, that was her feeding choice upon admission. RN was in the room working with mom and gave some feedback to [facility]. Per RN BF is going well, mom is constantly putting baby at the breast but as the feeding progresses, she tends to "lower" her arms when holding baby so the latch becomes shallow.  Spoke to mom about the characteristics of a good latch and offered latch assistance but mom politely declined, RN had to take baby's temperature and baby cried a lot, when it was [facility] for [facility] to start consult baby was already asleep and mom didn't with to disturb baby's sleep. Asked mom to call for latch assistance when needed.   Per mom feedings at the breast are comfortable and both nipples looked intact upon examination. Taught mom how to hand express and two big drops of colostrum came out very easily as soon as the first compression were made. Mom doesn't have a pump at home, she requested a manual pump. Reviewed pump instructions, assembly and storage; as well as milk storage guidelines.  Encouraged mom to feed baby 8-12 times/24 hours or sooner if feeding cues are present. Reviewed BF brochure, BF resources and feeding diary, mom is aware of LC services and will call PRN.  Maternal Data Formula Feeding for Exclusion: Yes Reason for exclusion: Mother's choice to formula and breast feed on admission Has patient been taught Hand Expression?: Yes Does the patient have breastfeeding experience prior to this delivery?: No  Feeding Feeding Type: Breast Fed Length of feed: 30 min  LATCH  Score Latch: Grasps breast easily, tongue down, lips flanged, rhythmical sucking.  Audible Swallowing: Spontaneous and intermittent  Type of Nipple: Everted at rest and after stimulation  Comfort (Breast/Nipple): Soft / non-tender  Hold (Positioning): Assistance needed to correctly position infant at breast and maintain latch.  LATCH Score: 9  Interventions Interventions: Breast feeding basics reviewed;Breast massage;Breast compression;Hand pump;Expressed milk;Hand express  Lactation Tools Discussed/Used WIC Program: No Pump Review: Setup, frequency, and cleaning;Milk Storage Initiated by:: MPeck Date initiated:: 08/18/17   Consult Status Consult Status: Follow-up Date: 08/19/17 Follow-up type: In-patient    Muhammadali Ries Venetia ConstableS Azael Ragain Kelley, 4:55 PM

## 2017-08-19 MED ORDER — IBUPROFEN 600 MG PO TABS: 600.0000 mg | ORAL_TABLET | Freq: Four times a day (QID) | ORAL | 0 refills | Status: AC

## 2017-08-19 NOTE — Progress Notes (Signed)
Patients assessment was done using the ipad interpreter # [id number]. RN educated mom on tdap (she declines), feeding amounts, bladder, and bleeding. School note given for the 19th-20th, but they are requesting a note for longer. Royston CowperIsley, Jahel Wavra E, RN'

## 2017-08-19 NOTE — Discharge Summary (Signed)
OB Discharge Summary  Patient Name: Peggy Kelley DOB: February 28, 1996 MRN: [id number]  Date of admission: 08/17/2017 Delivering MD: Thressa Sheller D   Date of discharge: 08/19/2017  Admitting diagnosis: 40 WEEKS ROM Intrauterine pregnancy: [redacted]w[redacted]d     Secondary diagnosis:Active Problems:   Labor and delivery, indication for care  Additional problems:none     Discharge diagnosis: Term Pregnancy Delivered                                                                     Post partum procedures:none  Augmentation: Pitocin  Complications: None  Hospital course:  Onset of Labor With Vaginal Delivery     22 y.o. yo [id number] at [redacted]w[redacted]d was admitted in Latent Labor on 08/17/2017. Patient had an uncomplicated labor course as follows:  Membrane Rupture Time/Date: 2:00 AM ,08/17/2017   Intrapartum Procedures: Episiotomy: None [1]                                         Lacerations:  2nd degree [3];Perineal [11]  Patient had a delivery of a Viable infant. 08/17/2017  Information for the patient's newborn:  Alayza, Pieper [[id number]]  Delivery Method: Vag-Spont    Pateint had an uncomplicated postpartum course.  She is ambulating, tolerating a regular diet, passing flatus, and urinating well. Patient is discharged home in stable condition on 08/19/17.   Physical exam  Vitals:   08/18/17 0300 08/18/17 0609 08/18/17 1725 08/19/17 0542  BP: 115/63 107/64 (!) 108/58 (!) 91/49  Pulse: 68 72 76 69  Resp: 16 16 18 16   Temp: 97.8 F (36.6 C) 98.6 F (37 C) 98.2 F (36.8 C) 98.2 F (36.8 C)  TempSrc: Axillary Oral Oral Oral  SpO2:   99%   Weight:      Height:       General: alert, cooperative and no distress Lochia: appropriate Uterine Fundus: firm Incision: Healing well with no significant drainage DVT Evaluation: No evidence of DVT seen on physical exam. Labs: Lab Results  Component Value Date   WBC 8.2 08/17/2017   HGB 9.2 (L) 08/17/2017   HCT 28.5 (L) 08/17/2017   MCV 62.4  (L) 08/17/2017   PLT 213 08/17/2017   No flowsheet data found.  Discharge instruction: per After Visit Summary and "Baby and Me Booklet".  After Visit Meds:  Allergies as of 08/19/2017   No Known Allergies     Medication List    TAKE these medications   cephALEXin 500 MG capsule Commonly known as:  KEFLEX Take 1 capsule (500 mg total) by mouth 3 (three) times daily.   ferrous sulfate 325 (65 FE) MG tablet Take 1 tablet (325 mg total) by mouth daily with breakfast.   ibuprofen 600 MG tablet Commonly known as:  ADVIL,MOTRIN Take 1 tablet (600 mg total) by mouth every 6 (six) hours.   multivitamin-prenatal 27-0.8 MG Tabs tablet Take 1 tablet by mouth daily at 12 noon.   ranitidine 300 MG tablet Commonly known as:  ZANTAC Take 1 tablet (300 mg total) by mouth at bedtime.   terconazole 0.8 % vaginal cream Commonly known as:  TERAZOL  3 Place 1 applicator vaginally at bedtime.       Diet: routine diet  Activity: Advance as tolerated. Pelvic rest for 6 weeks.   Outpatient follow up:6 weeks Follow up Appt: Future Appointments  Date Time Provider Department Center  08/21/2017  2:35 PM Pincus LargePhelps, Jazma Y, DO [facility]   Follow up visit: No follow-ups on file.  Postpartum contraception: Condoms  Newborn Data: Live born female  Birth Weight: 6 lb 3.8 oz (2830 g) APGAR: 6, 9  Newborn Delivery   Birth date/time:  08/17/2017 14:19:00 Delivery type:  Vaginal, Spontaneous     Baby Feeding: Breast Disposition:home with mother   08/19/2017 Wyvonnia DuskyMarie Lawson, CNM

## 2017-08-19 NOTE — Progress Notes (Signed)
Mom and baby D/C instructions gone over via interpreter Asmaa # [id number]. Patient is aware of all follow up appointments and declines having any further questions. Royston CowperIsley, Yurem Viner E, RN

## 2017-08-21 ENCOUNTER — Encounter: Payer: Self-pay | Admitting: Obstetrics and Gynecology

## 2017-10-08 ENCOUNTER — Encounter: Payer: Self-pay | Admitting: Family Medicine

## 2017-10-25 ENCOUNTER — Encounter (INDEPENDENT_AMBULATORY_CARE_PROVIDER_SITE_OTHER): Payer: Self-pay

## 2017-11-13 ENCOUNTER — Ambulatory Visit: Payer: PPO | Admitting: Advanced Practice Midwife

## 2018-09-13 IMAGING — US US MFM OB LIMITED
1 series · 13 of 13 positions shown · non-contrast
Comparison: none

[Series 1: us mfm ob limited · 13 of 13 slices shown]
[im 1/13]
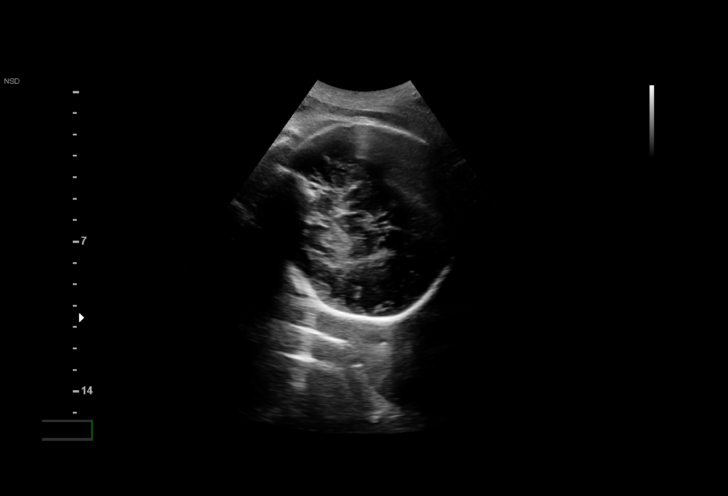
[im 2/13]
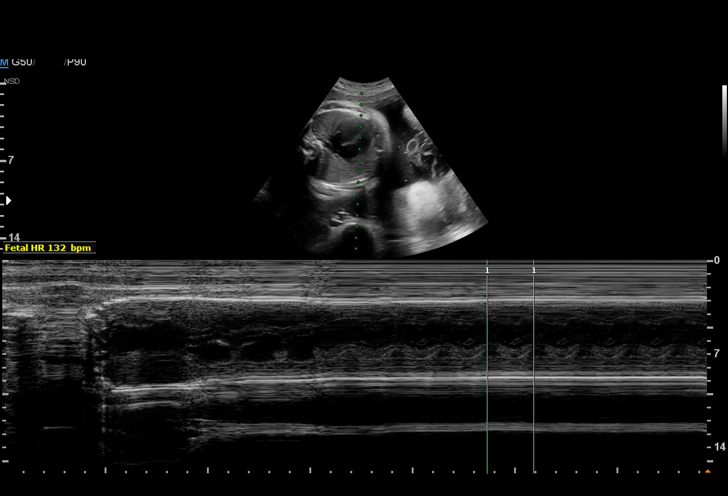
[im 3/13]
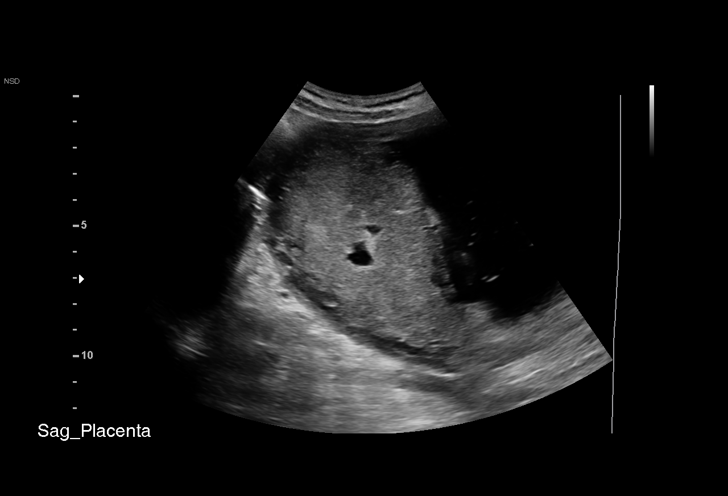
[im 4/13]
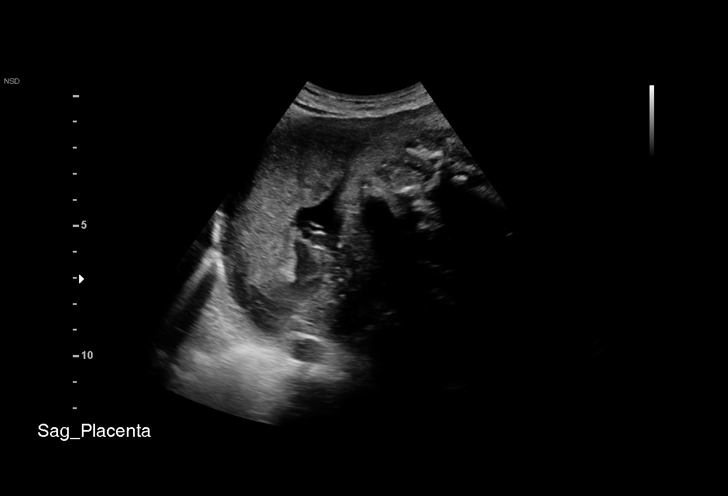
[im 5/13]
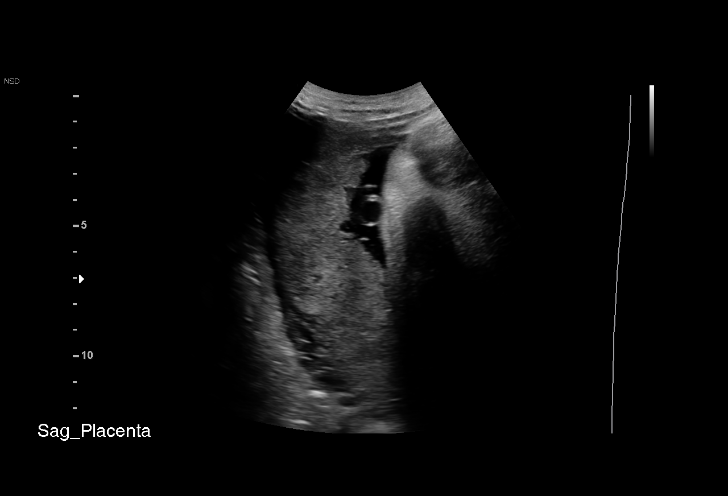
[im 6/13]
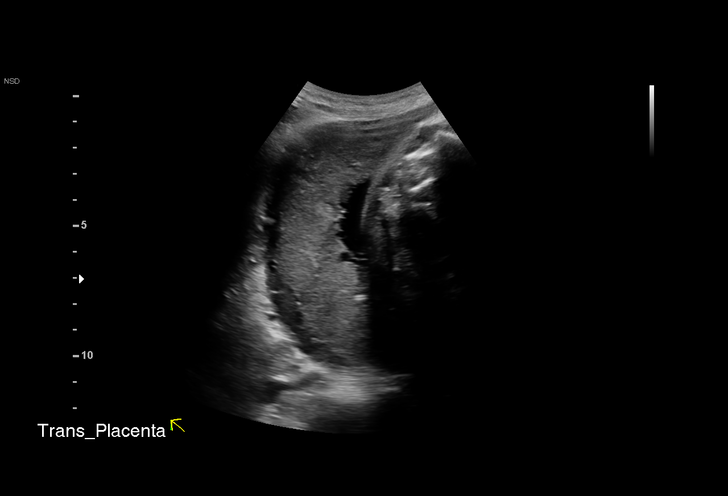
[im 7/13]
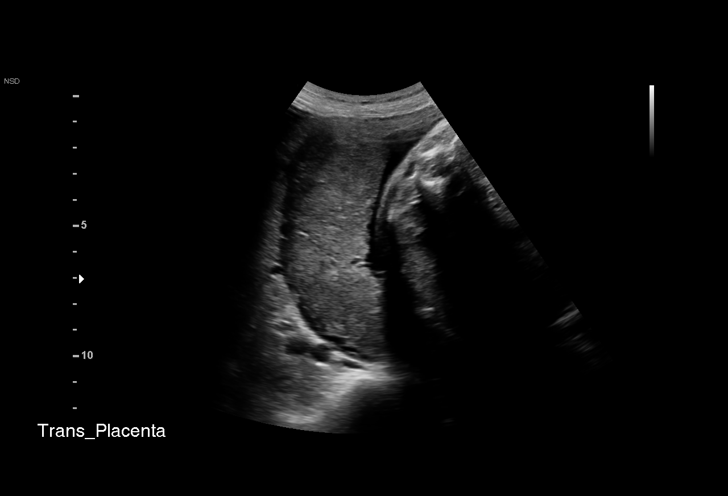
[im 8/13]
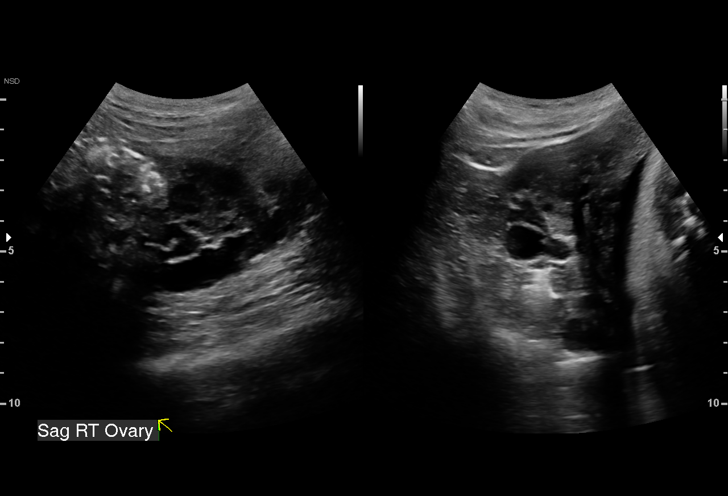
[im 9/13]
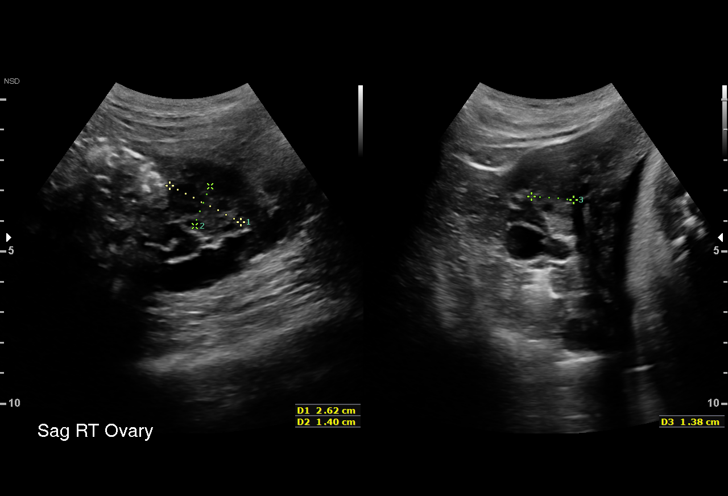
[im 10/13]
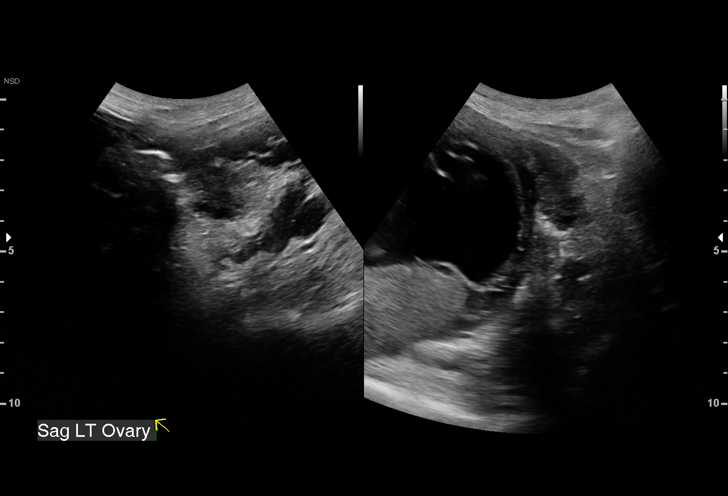
[im 11/13]
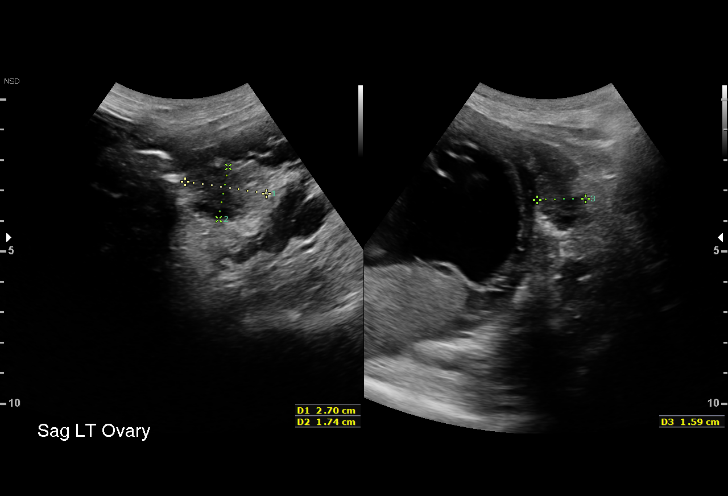
[im 12/13]
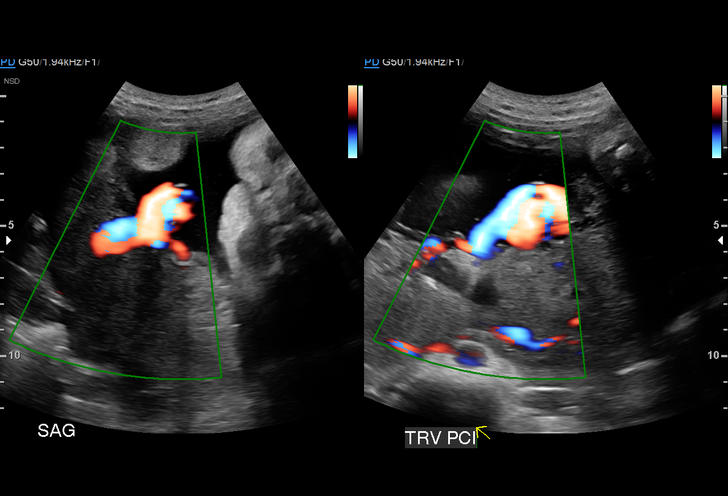
[im 13/13]
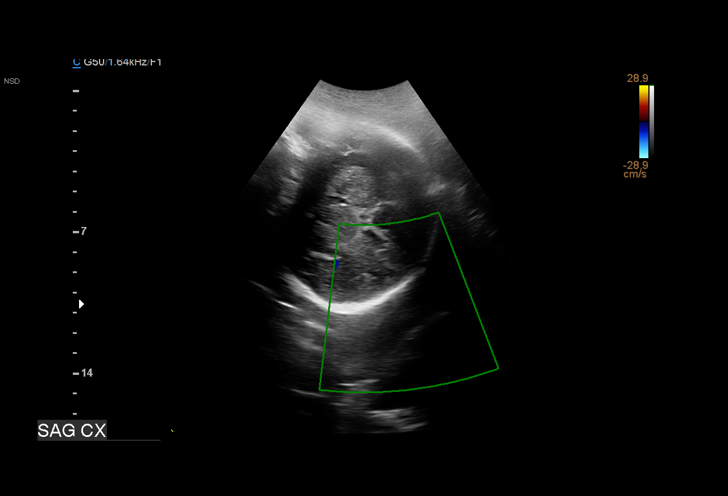

[13 of 13 positions shown; findings below may reference images not displayed]

MAU/Triage

1  PACHECO CHAWLA          [id number]      [id number]     [id number]
Indications

35 weeks gestation of pregnancy
Abdominal pain in pregnancy
Insufficient Prenatal Care (Limited-Had [facility]
[place])
OB History

Gravidity:    4         Term:   0        Prem:   0        SAB:   3
TOP:          0       Ectopic:  0        Living: 0
Fetal Evaluation

Num Of Fetuses:     1
Fetal Heart         132
Rate(bpm):
Cardiac Activity:   Observed
Presentation:       Cephalic
Placenta:           Fundal, above cervical os
P. Cord Insertion:  Visualized, central

Amniotic Fluid
AFI FV:      Subjectively within normal limits

AFI Sum(cm)     %Tile       Largest Pocket(cm)
21.05           79

RUQ(cm)       RLQ(cm)       LUQ(cm)        LLQ(cm)
5.89
Gestational Age

Clinical EDD:  35w 3d                                        EDD:   08/23/17
Best:          35w 3d     Det. By:  Clinical EDD             EDD:   08/23/17
Cervix Uterus Adnexa

Cervix
Not visualized (advanced GA >97wks)

Left Ovary
Within normal limits.

Right Ovary
Within normal limits.

Adnexa:       No abnormality visualized.
Impression

Singleton intrauterine pregnancy at 35+3 weeks with
abdominal pain here for requested limited scan
Cephalic presentation
Normal fetal cardiac activity and movement
Fundal placenta without previa
Amniotic fluid volume is normal
Maternal adnexa are normal
Recommendations

Continue clinical evaluation and management.
